# Patient Record
Sex: Female | Born: 1950
Health system: Southern US, Community
[De-identification: ages and names within clinical notes are randomized; demographics above are authoritative.]

## PROBLEM LIST (undated history)

## (undated) DIAGNOSIS — I10 Essential (primary) hypertension: Secondary | ICD-10-CM

## (undated) DIAGNOSIS — F419 Anxiety disorder, unspecified: Secondary | ICD-10-CM

## (undated) DIAGNOSIS — E785 Hyperlipidemia, unspecified: Secondary | ICD-10-CM

## (undated) DIAGNOSIS — F329 Major depressive disorder, single episode, unspecified: Secondary | ICD-10-CM

## (undated) DIAGNOSIS — G709 Myoneural disorder, unspecified: Secondary | ICD-10-CM

## (undated) DIAGNOSIS — F32A Depression, unspecified: Secondary | ICD-10-CM

## (undated) DIAGNOSIS — M810 Age-related osteoporosis without current pathological fracture: Secondary | ICD-10-CM

## (undated) DIAGNOSIS — G2581 Restless legs syndrome: Secondary | ICD-10-CM

## (undated) HISTORY — DX: Myoneural disorder, unspecified: G70.9

## (undated) HISTORY — DX: Major depressive disorder, single episode, unspecified: F32.9

## (undated) HISTORY — DX: Hyperlipidemia, unspecified: E78.5

## (undated) HISTORY — DX: Anxiety disorder, unspecified: F41.9

## (undated) HISTORY — PX: OTHER SURGICAL HISTORY: SHX169

## (undated) HISTORY — DX: Depression, unspecified: F32.A

## (undated) HISTORY — DX: Restless legs syndrome: G25.81

## (undated) HISTORY — DX: Essential (primary) hypertension: I10

## (undated) HISTORY — DX: Age-related osteoporosis without current pathological fracture: M81.0

---

## 2014-08-05 DIAGNOSIS — F4310 Post-traumatic stress disorder, unspecified: Secondary | ICD-10-CM | POA: Diagnosis not present

## 2014-08-27 DIAGNOSIS — Z87891 Personal history of nicotine dependence: Secondary | ICD-10-CM | POA: Diagnosis not present

## 2014-08-27 DIAGNOSIS — F411 Generalized anxiety disorder: Secondary | ICD-10-CM | POA: Diagnosis not present

## 2014-08-27 DIAGNOSIS — E785 Hyperlipidemia, unspecified: Secondary | ICD-10-CM | POA: Diagnosis not present

## 2014-08-27 DIAGNOSIS — M810 Age-related osteoporosis without current pathological fracture: Secondary | ICD-10-CM | POA: Diagnosis not present

## 2014-10-26 DIAGNOSIS — G2581 Restless legs syndrome: Secondary | ICD-10-CM | POA: Diagnosis not present

## 2014-10-26 DIAGNOSIS — F329 Major depressive disorder, single episode, unspecified: Secondary | ICD-10-CM | POA: Diagnosis not present

## 2014-10-26 DIAGNOSIS — F411 Generalized anxiety disorder: Secondary | ICD-10-CM | POA: Diagnosis not present

## 2014-10-26 DIAGNOSIS — E785 Hyperlipidemia, unspecified: Secondary | ICD-10-CM | POA: Diagnosis not present

## 2014-10-26 DIAGNOSIS — Z23 Encounter for immunization: Secondary | ICD-10-CM | POA: Diagnosis not present

## 2015-01-12 DIAGNOSIS — Z79899 Other long term (current) drug therapy: Secondary | ICD-10-CM | POA: Diagnosis not present

## 2015-01-12 DIAGNOSIS — Z23 Encounter for immunization: Secondary | ICD-10-CM | POA: Diagnosis not present

## 2015-01-12 DIAGNOSIS — G2581 Restless legs syndrome: Secondary | ICD-10-CM | POA: Diagnosis not present

## 2015-01-12 DIAGNOSIS — E785 Hyperlipidemia, unspecified: Secondary | ICD-10-CM | POA: Diagnosis not present

## 2015-01-12 DIAGNOSIS — F411 Generalized anxiety disorder: Secondary | ICD-10-CM | POA: Diagnosis not present

## 2015-08-10 DIAGNOSIS — G2581 Restless legs syndrome: Secondary | ICD-10-CM | POA: Diagnosis not present

## 2015-08-10 DIAGNOSIS — Z79899 Other long term (current) drug therapy: Secondary | ICD-10-CM | POA: Diagnosis not present

## 2015-08-10 DIAGNOSIS — E785 Hyperlipidemia, unspecified: Secondary | ICD-10-CM | POA: Diagnosis not present

## 2015-08-10 DIAGNOSIS — F411 Generalized anxiety disorder: Secondary | ICD-10-CM | POA: Diagnosis not present

## 2016-02-16 DIAGNOSIS — G2581 Restless legs syndrome: Secondary | ICD-10-CM | POA: Diagnosis not present

## 2016-02-16 DIAGNOSIS — F411 Generalized anxiety disorder: Secondary | ICD-10-CM | POA: Diagnosis not present

## 2016-02-16 DIAGNOSIS — M810 Age-related osteoporosis without current pathological fracture: Secondary | ICD-10-CM | POA: Diagnosis not present

## 2016-02-16 DIAGNOSIS — E785 Hyperlipidemia, unspecified: Secondary | ICD-10-CM | POA: Diagnosis not present

## 2016-02-16 DIAGNOSIS — Z23 Encounter for immunization: Secondary | ICD-10-CM | POA: Diagnosis not present

## 2016-02-16 DIAGNOSIS — Z1231 Encounter for screening mammogram for malignant neoplasm of breast: Secondary | ICD-10-CM | POA: Diagnosis not present

## 2016-02-16 DIAGNOSIS — I10 Essential (primary) hypertension: Secondary | ICD-10-CM | POA: Diagnosis not present

## 2016-02-16 DIAGNOSIS — F3342 Major depressive disorder, recurrent, in full remission: Secondary | ICD-10-CM | POA: Diagnosis not present

## 2016-02-25 ENCOUNTER — Other Ambulatory Visit: Payer: Self-pay | Admitting: Family Medicine

## 2016-02-25 DIAGNOSIS — Z780 Asymptomatic menopausal state: Secondary | ICD-10-CM

## 2016-02-25 DIAGNOSIS — Z1239 Encounter for other screening for malignant neoplasm of breast: Secondary | ICD-10-CM

## 2016-03-10 DIAGNOSIS — I10 Essential (primary) hypertension: Secondary | ICD-10-CM | POA: Diagnosis not present

## 2016-03-10 DIAGNOSIS — G2581 Restless legs syndrome: Secondary | ICD-10-CM | POA: Diagnosis not present

## 2016-05-21 DIAGNOSIS — I2510 Atherosclerotic heart disease of native coronary artery without angina pectoris: Secondary | ICD-10-CM | POA: Diagnosis not present

## 2016-05-21 DIAGNOSIS — Z87891 Personal history of nicotine dependence: Secondary | ICD-10-CM | POA: Diagnosis not present

## 2016-05-21 DIAGNOSIS — I700 Atherosclerosis of aorta: Secondary | ICD-10-CM | POA: Diagnosis not present

## 2016-05-21 DIAGNOSIS — M810 Age-related osteoporosis without current pathological fracture: Secondary | ICD-10-CM | POA: Diagnosis not present

## 2016-05-21 DIAGNOSIS — K760 Fatty (change of) liver, not elsewhere classified: Secondary | ICD-10-CM | POA: Diagnosis not present

## 2016-05-21 DIAGNOSIS — N289 Disorder of kidney and ureter, unspecified: Secondary | ICD-10-CM | POA: Diagnosis not present

## 2016-05-21 DIAGNOSIS — K625 Hemorrhage of anus and rectum: Secondary | ICD-10-CM | POA: Diagnosis not present

## 2016-05-21 DIAGNOSIS — K921 Melena: Secondary | ICD-10-CM | POA: Diagnosis not present

## 2016-05-21 DIAGNOSIS — Z79899 Other long term (current) drug therapy: Secondary | ICD-10-CM | POA: Diagnosis not present

## 2016-05-21 DIAGNOSIS — E7800 Pure hypercholesterolemia, unspecified: Secondary | ICD-10-CM | POA: Diagnosis not present

## 2016-05-21 DIAGNOSIS — F419 Anxiety disorder, unspecified: Secondary | ICD-10-CM | POA: Diagnosis not present

## 2016-05-21 DIAGNOSIS — R1033 Periumbilical pain: Secondary | ICD-10-CM | POA: Diagnosis not present

## 2016-06-07 DIAGNOSIS — G2581 Restless legs syndrome: Secondary | ICD-10-CM | POA: Diagnosis not present

## 2016-06-07 DIAGNOSIS — K625 Hemorrhage of anus and rectum: Secondary | ICD-10-CM | POA: Diagnosis not present

## 2016-06-07 DIAGNOSIS — F411 Generalized anxiety disorder: Secondary | ICD-10-CM | POA: Diagnosis not present

## 2016-06-07 DIAGNOSIS — I10 Essential (primary) hypertension: Secondary | ICD-10-CM | POA: Diagnosis not present

## 2016-06-07 DIAGNOSIS — E785 Hyperlipidemia, unspecified: Secondary | ICD-10-CM | POA: Diagnosis not present

## 2016-06-07 DIAGNOSIS — Z1159 Encounter for screening for other viral diseases: Secondary | ICD-10-CM | POA: Diagnosis not present

## 2016-06-07 DIAGNOSIS — F3342 Major depressive disorder, recurrent, in full remission: Secondary | ICD-10-CM | POA: Diagnosis not present

## 2016-06-07 DIAGNOSIS — Z780 Asymptomatic menopausal state: Secondary | ICD-10-CM | POA: Diagnosis not present

## 2016-06-07 DIAGNOSIS — R030 Elevated blood-pressure reading, without diagnosis of hypertension: Secondary | ICD-10-CM | POA: Diagnosis not present

## 2016-06-12 ENCOUNTER — Encounter: Payer: Self-pay | Admitting: Gastroenterology

## 2016-08-07 ENCOUNTER — Encounter: Payer: Self-pay | Admitting: Gastroenterology

## 2016-08-07 ENCOUNTER — Ambulatory Visit (AMBULATORY_SURGERY_CENTER): Payer: Self-pay | Admitting: *Deleted

## 2016-08-07 ENCOUNTER — Encounter (INDEPENDENT_AMBULATORY_CARE_PROVIDER_SITE_OTHER): Payer: Self-pay

## 2016-08-07 VITALS — Ht 63.0 in | Wt 133.0 lb

## 2016-08-07 DIAGNOSIS — Z1211 Encounter for screening for malignant neoplasm of colon: Secondary | ICD-10-CM

## 2016-08-07 MED ORDER — NA SULFATE-K SULFATE-MG SULF 17.5-3.13-1.6 GM/177ML PO SOLN: ORAL | 0 refills | Status: DC

## 2016-08-07 NOTE — Progress Notes (Signed)
Pt denies allergies to eggs or soy products. Denies difficulty with sedation or anesthesia. Denies any diet or weight loss medications. Denies use of supplemental oxygen.  Emmi instructions refused strongly.

## 2016-08-21 ENCOUNTER — Encounter: Payer: Self-pay | Admitting: Gastroenterology

## 2016-08-21 ENCOUNTER — Ambulatory Visit (AMBULATORY_SURGERY_CENTER): Payer: Medicare HMO | Admitting: Gastroenterology

## 2016-08-21 VITALS — BP 132/69 | HR 66 | Temp 98.9°F | Resp 18 | Ht 63.0 in | Wt 133.0 lb

## 2016-08-21 DIAGNOSIS — D122 Benign neoplasm of ascending colon: Secondary | ICD-10-CM | POA: Diagnosis not present

## 2016-08-21 DIAGNOSIS — D125 Benign neoplasm of sigmoid colon: Secondary | ICD-10-CM

## 2016-08-21 DIAGNOSIS — Z1212 Encounter for screening for malignant neoplasm of rectum: Secondary | ICD-10-CM | POA: Diagnosis not present

## 2016-08-21 DIAGNOSIS — Z1211 Encounter for screening for malignant neoplasm of colon: Secondary | ICD-10-CM

## 2016-08-21 DIAGNOSIS — K635 Polyp of colon: Secondary | ICD-10-CM

## 2016-08-21 MED ORDER — SODIUM CHLORIDE 0.9 % IV SOLN: 500.0000 mL | INTRAVENOUS | Status: AC

## 2016-08-21 NOTE — Op Note (Signed)
[facility] Patient Name: Tiffany Cruz Procedure Date: 08/21/2016 10:03 AM MRN: [id number] Endoscopist: Mallie Mussel L. Loletha Carrow , MD Age: 66 Referring MD:  Date of Birth: 22-Nov-1950 Gender: Female Account #: [id number] Procedure:                Colonoscopy Indications:              Screening for colorectal malignant neoplasm, This                            is the patient's first colonoscopy Medicines:                Monitored Anesthesia Care Procedure:                Pre-Anesthesia Assessment:                           - Prior to the procedure, a History and Physical                            was performed, and patient medications and                            allergies were reviewed. The patient's tolerance of                            previous anesthesia was also reviewed. The risks                            and benefits of the procedure and the sedation                            options and risks were discussed with the patient.                            All questions were answered, and informed consent                            was obtained. Prior Anticoagulants: The patient has                            taken no previous anticoagulant or antiplatelet                            agents. ASA Grade Assessment: II - A patient with                            mild systemic disease. After reviewing the risks                            and benefits, the patient was deemed in                            satisfactory condition to undergo the procedure.  After obtaining informed consent, the colonoscope                            was passed under direct vision. Throughout the                            procedure, the patient's blood pressure, pulse, and                            oxygen saturations were monitored continuously. The                            Colonoscope was introduced through the anus and                            advanced to the the cecum,  identified by                            appendiceal orifice and ileocecal valve. The                            colonoscopy was performed without difficulty. The                            patient tolerated the procedure well. The quality                            of the bowel preparation was good. The ileocecal                            valve, appendiceal orifice, and rectum were                            photographed. The quality of the bowel preparation                            was evaluated using the BBPS [facility] Bowel                            Preparation Scale) with scores of: Right Colon = 2,                            Transverse Colon = 2 and Left Colon = 2. The total                            BBPS score equals 6. After lavage. The bowel                            preparation used was SUPREP. Scope In: 10:11:41 AM Scope Out: 10:34:36 AM Scope Withdrawal Time: 0 hours 13 minutes 32 seconds  Total Procedure Duration: 0 hours 22 minutes 55 seconds  Findings:                 The digital rectal  exam findings include decreased                            sphincter tone.                           A 4 mm polyp was found in the ascending colon. The                            polyp was sessile. The polyp was removed with a                            cold snare. Resection and retrieval were complete.                           A 8 mm polyp was found in the distal sigmoid colon.                            The polyp was pedunculated. The polyp was removed                            with a hot snare. Resection and retrieval were                            complete.                           Internal hemorrhoids were found during retroflexion                            and during anoscopy. The hemorrhoids were small and                            Grade I (internal hemorrhoids that do not prolapse).                           The exam was otherwise without abnormality on                             direct and retroflexion views. Complications:            No immediate complications. Estimated Blood Loss:     Estimated blood loss: none. Impression:               - Decreased sphincter tone found on digital rectal                            exam.                           - One 4 mm polyp in the ascending colon, removed                            with a cold snare. Resected and retrieved.                           -  One 8 mm polyp in the distal sigmoid colon,                            removed with a hot snare. Resected and retrieved.                           - Internal hemorrhoids.                           - The examination was otherwise normal on direct                            and retroflexion views. Recommendation:           - Patient has a contact number available for                            emergencies. The signs and symptoms of potential                            delayed complications were discussed with the                            patient. Return to normal activities tomorrow.                            Written discharge instructions were provided to the                            patient.                           - Resume previous diet.                           - Continue present medications.                           - No aspirin, ibuprofen, naproxen, or other                            non-steroidal anti-inflammatory drugs for 5 days                            after polyp removal.                           - Await pathology results.                           - Repeat colonoscopy is recommended for                            surveillance. The colonoscopy date will be                            determined after pathology results  from today's                            exam become available for review. [employer] L. Loletha Carrow, MD 08/21/2016 10:35:22 AM This report has been signed electronically.

## 2016-08-21 NOTE — Progress Notes (Signed)
Pt's states no medical or surgical changes since previsit or office visit.n

## 2016-08-21 NOTE — Patient Instructions (Signed)
YOU HAD AN ENDOSCOPIC PROCEDURE TODAY AT [facility]:   Refer to the procedure report that was given to you for any specific questions about what was found during the examination.  If the procedure report does not answer your questions, please call your gastroenterologist to clarify.  If you requested that your care partner not be given the details of your procedure findings, then the procedure report has been included in a sealed envelope for you to review at your convenience later.  YOU SHOULD EXPECT: Some feelings of bloating in the abdomen. Passage of more gas than usual.  Walking can help get rid of the air that was put into your GI tract during the procedure and reduce the bloating. If you had a lower endoscopy (such as a colonoscopy or flexible sigmoidoscopy) you may notice spotting of blood in your stool or on the toilet paper. If you underwent a bowel prep for your procedure, you may not have a normal bowel movement for a few days.  Please Note:  You might notice some irritation and congestion in your nose or some drainage.  This is from the oxygen used during your procedure.  There is no need for concern and it should clear up in a day or so.  SYMPTOMS TO REPORT IMMEDIATELY:   Following lower endoscopy (colonoscopy or flexible sigmoidoscopy):  Excessive amounts of blood in the stool  Significant tenderness or worsening of abdominal pains  Swelling of the abdomen that is new, acute  Fever of 100F or higher  For urgent or emergent issues, a gastroenterologist can be reached at any hour by calling [phone].   DIET:  We do recommend a small meal at first, but then you may proceed to your regular diet.  Drink plenty of fluids but you should avoid alcoholic beverages for 24 hours.  ACTIVITY:  You should plan to take it easy for the rest of today and you should NOT DRIVE or use heavy machinery until tomorrow (because of the sedation medicines used during the test).     FOLLOW UP: Our staff will call the number listed on your records the next business day following your procedure to check on you and address any questions or concerns that you may have regarding the information given to you following your procedure. If we do not reach you, we will leave a message.  However, if you are feeling well and you are not experiencing any problems, there is no need to return our call.  We will assume that you have returned to your regular daily activities without incident.  If any biopsies were taken you will be contacted by phone or by letter within the next 1-3 weeks.  Please call us at [phone] if you have not heard about the biopsies in 3 weeks.   Await for biopsy results to determine next repeat Colonoscopy screening Polyps (handout given) No Aspirin, Ibuprofen, Naproxen or other non-steriodal anti-inflammatory drugs for 5 days after polyp removal Hemorrhoids (handout given)   SIGNATURES/CONFIDENTIALITY: You and/or your care partner have signed paperwork which will be entered into your electronic medical record.  These signatures attest to the fact that that the information above on your After Visit Summary has been reviewed and is understood.  Full responsibility of the confidentiality of this discharge information lies with you and/or your care-partner.

## 2016-08-21 NOTE — Progress Notes (Signed)
To PACU, vss patent aw report to rn 

## 2016-08-21 NOTE — Progress Notes (Signed)
Called to room to assist during endoscopic procedure.  Patient ID and intended procedure confirmed with present staff. Received instructions for my participation in the procedure from the performing physician.  

## 2016-08-22 ENCOUNTER — Telehealth: Payer: Self-pay

## 2016-08-22 NOTE — Telephone Encounter (Signed)
  Follow up Call-  Call back number 08/21/2016  Post procedure Call Back phone  # [phone]  Permission to leave phone message Yes     Patient questions:  Do you have a fever, pain , or abdominal swelling? No. Pain Score  0 *  Have you tolerated food without any problems? Yes.    Have you been able to return to your normal activities? Yes.    Do you have any questions about your discharge instructions: Diet   No. Medications  No. Follow up visit  No.  Do you have questions or concerns about your Care? No.  Actions: * If pain score is 4 or above: No action needed, pain <4.

## 2016-08-28 ENCOUNTER — Encounter: Payer: Self-pay | Admitting: Gastroenterology

## 2016-10-02 DIAGNOSIS — F3342 Major depressive disorder, recurrent, in full remission: Secondary | ICD-10-CM | POA: Diagnosis not present

## 2016-10-02 DIAGNOSIS — Z780 Asymptomatic menopausal state: Secondary | ICD-10-CM | POA: Diagnosis not present

## 2016-10-02 DIAGNOSIS — I10 Essential (primary) hypertension: Secondary | ICD-10-CM | POA: Diagnosis not present

## 2016-10-02 DIAGNOSIS — Z23 Encounter for immunization: Secondary | ICD-10-CM | POA: Diagnosis not present

## 2016-10-02 DIAGNOSIS — F411 Generalized anxiety disorder: Secondary | ICD-10-CM | POA: Diagnosis not present

## 2016-10-02 DIAGNOSIS — E785 Hyperlipidemia, unspecified: Secondary | ICD-10-CM | POA: Diagnosis not present

## 2016-10-02 DIAGNOSIS — G2581 Restless legs syndrome: Secondary | ICD-10-CM | POA: Diagnosis not present

## 2016-11-03 DIAGNOSIS — Z23 Encounter for immunization: Secondary | ICD-10-CM | POA: Diagnosis not present

## 2016-11-03 DIAGNOSIS — L700 Acne vulgaris: Secondary | ICD-10-CM | POA: Diagnosis not present

## 2016-11-03 DIAGNOSIS — I10 Essential (primary) hypertension: Secondary | ICD-10-CM | POA: Diagnosis not present

## 2016-11-03 DIAGNOSIS — F411 Generalized anxiety disorder: Secondary | ICD-10-CM | POA: Diagnosis not present

## 2016-11-27 DIAGNOSIS — M8588 Other specified disorders of bone density and structure, other site: Secondary | ICD-10-CM | POA: Diagnosis not present

## 2016-11-27 DIAGNOSIS — Z780 Asymptomatic menopausal state: Secondary | ICD-10-CM | POA: Diagnosis not present

## 2016-11-27 DIAGNOSIS — M85852 Other specified disorders of bone density and structure, left thigh: Secondary | ICD-10-CM | POA: Diagnosis not present

## 2016-12-20 DIAGNOSIS — L700 Acne vulgaris: Secondary | ICD-10-CM | POA: Diagnosis not present

## 2016-12-20 DIAGNOSIS — M810 Age-related osteoporosis without current pathological fracture: Secondary | ICD-10-CM | POA: Diagnosis not present

## 2017-05-01 DIAGNOSIS — F411 Generalized anxiety disorder: Secondary | ICD-10-CM | POA: Diagnosis not present

## 2017-05-01 DIAGNOSIS — I10 Essential (primary) hypertension: Secondary | ICD-10-CM | POA: Diagnosis not present

## 2017-05-01 DIAGNOSIS — E785 Hyperlipidemia, unspecified: Secondary | ICD-10-CM | POA: Diagnosis not present

## 2017-05-01 DIAGNOSIS — G2581 Restless legs syndrome: Secondary | ICD-10-CM | POA: Diagnosis not present

## 2017-05-01 DIAGNOSIS — M654 Radial styloid tenosynovitis [de Quervain]: Secondary | ICD-10-CM | POA: Diagnosis not present

## 2017-05-01 DIAGNOSIS — M810 Age-related osteoporosis without current pathological fracture: Secondary | ICD-10-CM | POA: Diagnosis not present

## 2017-05-01 DIAGNOSIS — E559 Vitamin D deficiency, unspecified: Secondary | ICD-10-CM | POA: Diagnosis not present

## 2017-05-21 ENCOUNTER — Encounter (HOSPITAL_COMMUNITY): Payer: Self-pay

## 2017-05-21 ENCOUNTER — Ambulatory Visit (HOSPITAL_COMMUNITY)
Admission: RE | Admit: 2017-05-21 | Discharge: 2017-05-21 | Disposition: A | Discharge status: Home / Self Care | Payer: Medicare HMO | Source: Ambulatory Visit | Attending: Family Medicine | Admitting: Family Medicine

## 2017-05-21 DIAGNOSIS — M810 Age-related osteoporosis without current pathological fracture: Secondary | ICD-10-CM | POA: Insufficient documentation

## 2017-05-21 MED ORDER — SODIUM CHLORIDE 0.9 % IV SOLN
Freq: Once | INTRAVENOUS | Status: AC
  Administered 2017-05-21: 11:00:00 via INTRAVENOUS

## 2017-05-21 MED ORDER — ZOLEDRONIC ACID 5 MG/100ML IV SOLN
5.0000 mg | Freq: Once | INTRAVENOUS | Status: AC
  Administered 2017-05-21: 5 mg via INTRAVENOUS
  Filled 2017-05-21: qty 100

## 2017-05-21 NOTE — Discharge Instructions (Signed)

## 2017-11-07 ENCOUNTER — Other Ambulatory Visit: Payer: Self-pay | Admitting: Family Medicine

## 2017-11-07 DIAGNOSIS — E559 Vitamin D deficiency, unspecified: Secondary | ICD-10-CM | POA: Diagnosis not present

## 2017-11-07 DIAGNOSIS — I10 Essential (primary) hypertension: Secondary | ICD-10-CM | POA: Diagnosis not present

## 2017-11-07 DIAGNOSIS — Z23 Encounter for immunization: Secondary | ICD-10-CM | POA: Diagnosis not present

## 2017-11-07 DIAGNOSIS — F411 Generalized anxiety disorder: Secondary | ICD-10-CM | POA: Diagnosis not present

## 2017-11-07 DIAGNOSIS — E785 Hyperlipidemia, unspecified: Secondary | ICD-10-CM | POA: Diagnosis not present

## 2017-11-07 DIAGNOSIS — Z1239 Encounter for other screening for malignant neoplasm of breast: Secondary | ICD-10-CM | POA: Diagnosis not present

## 2017-11-07 DIAGNOSIS — G2581 Restless legs syndrome: Secondary | ICD-10-CM | POA: Diagnosis not present

## 2017-11-07 DIAGNOSIS — Z6823 Body mass index (BMI) 23.0-23.9, adult: Secondary | ICD-10-CM | POA: Diagnosis not present

## 2017-11-07 DIAGNOSIS — M810 Age-related osteoporosis without current pathological fracture: Secondary | ICD-10-CM | POA: Diagnosis not present

## 2017-11-07 DIAGNOSIS — Z1231 Encounter for screening mammogram for malignant neoplasm of breast: Secondary | ICD-10-CM

## 2018-05-14 DIAGNOSIS — G2581 Restless legs syndrome: Secondary | ICD-10-CM | POA: Diagnosis not present

## 2018-05-14 DIAGNOSIS — Z1231 Encounter for screening mammogram for malignant neoplasm of breast: Secondary | ICD-10-CM | POA: Diagnosis not present

## 2018-05-14 DIAGNOSIS — E785 Hyperlipidemia, unspecified: Secondary | ICD-10-CM | POA: Diagnosis not present

## 2018-05-14 DIAGNOSIS — F411 Generalized anxiety disorder: Secondary | ICD-10-CM | POA: Diagnosis not present

## 2018-05-14 DIAGNOSIS — I10 Essential (primary) hypertension: Secondary | ICD-10-CM | POA: Diagnosis not present

## 2018-05-14 DIAGNOSIS — E559 Vitamin D deficiency, unspecified: Secondary | ICD-10-CM | POA: Diagnosis not present

## 2018-05-16 ENCOUNTER — Other Ambulatory Visit: Payer: Self-pay | Admitting: Family Medicine

## 2018-05-16 DIAGNOSIS — Z1231 Encounter for screening mammogram for malignant neoplasm of breast: Secondary | ICD-10-CM

## 2018-08-02 ENCOUNTER — Ambulatory Visit: Payer: Medicare HMO

## 2018-08-23 DIAGNOSIS — L03116 Cellulitis of left lower limb: Secondary | ICD-10-CM | POA: Diagnosis not present

## 2018-08-23 DIAGNOSIS — W57XXXA Bitten or stung by nonvenomous insect and other nonvenomous arthropods, initial encounter: Secondary | ICD-10-CM | POA: Diagnosis not present

## 2018-09-12 ENCOUNTER — Ambulatory Visit
Admission: RE | Admit: 2018-09-12 | Discharge: 2018-09-12 | Disposition: A | Discharge status: Home / Self Care | Payer: Medicare HMO | Source: Ambulatory Visit | Attending: Family Medicine | Admitting: Family Medicine

## 2018-09-12 ENCOUNTER — Other Ambulatory Visit: Payer: Self-pay

## 2018-09-12 DIAGNOSIS — Z1231 Encounter for screening mammogram for malignant neoplasm of breast: Secondary | ICD-10-CM | POA: Diagnosis not present

## 2018-10-11 DIAGNOSIS — F3342 Major depressive disorder, recurrent, in full remission: Secondary | ICD-10-CM | POA: Diagnosis not present

## 2018-10-11 DIAGNOSIS — E785 Hyperlipidemia, unspecified: Secondary | ICD-10-CM | POA: Diagnosis not present

## 2018-10-11 DIAGNOSIS — Z0000 Encounter for general adult medical examination without abnormal findings: Secondary | ICD-10-CM | POA: Diagnosis not present

## 2018-10-11 DIAGNOSIS — F411 Generalized anxiety disorder: Secondary | ICD-10-CM | POA: Diagnosis not present

## 2018-10-11 DIAGNOSIS — M810 Age-related osteoporosis without current pathological fracture: Secondary | ICD-10-CM | POA: Diagnosis not present

## 2018-10-11 DIAGNOSIS — G2581 Restless legs syndrome: Secondary | ICD-10-CM | POA: Diagnosis not present

## 2018-10-22 DIAGNOSIS — M859 Disorder of bone density and structure, unspecified: Secondary | ICD-10-CM | POA: Diagnosis not present

## 2018-10-22 DIAGNOSIS — E785 Hyperlipidemia, unspecified: Secondary | ICD-10-CM | POA: Diagnosis not present

## 2018-10-22 DIAGNOSIS — D509 Iron deficiency anemia, unspecified: Secondary | ICD-10-CM | POA: Diagnosis not present

## 2018-10-22 DIAGNOSIS — G2581 Restless legs syndrome: Secondary | ICD-10-CM | POA: Diagnosis not present

## 2018-10-24 ENCOUNTER — Other Ambulatory Visit: Payer: Self-pay | Admitting: Family Medicine

## 2018-10-24 DIAGNOSIS — M810 Age-related osteoporosis without current pathological fracture: Secondary | ICD-10-CM

## 2018-11-04 ENCOUNTER — Other Ambulatory Visit (HOSPITAL_COMMUNITY): Payer: Self-pay | Admitting: *Deleted

## 2018-11-05 ENCOUNTER — Other Ambulatory Visit: Payer: Self-pay

## 2018-11-05 ENCOUNTER — Ambulatory Visit (HOSPITAL_COMMUNITY)
Admission: RE | Admit: 2018-11-05 | Discharge: 2018-11-05 | Disposition: A | Discharge status: Home / Self Care | Payer: Medicare HMO | Source: Ambulatory Visit | Attending: Family Medicine | Admitting: Family Medicine

## 2018-11-05 DIAGNOSIS — M810 Age-related osteoporosis without current pathological fracture: Secondary | ICD-10-CM | POA: Diagnosis not present

## 2018-11-05 MED ORDER — ZOLEDRONIC ACID 5 MG/100ML IV SOLN
INTRAVENOUS | Status: AC
  Administered 2018-11-05: 08:00:00 5 mg via INTRAVENOUS
  Filled 2018-11-05: qty 100

## 2018-11-05 MED ORDER — ZOLEDRONIC ACID 5 MG/100ML IV SOLN
5.0000 mg | Freq: Once | INTRAVENOUS | Status: AC
  Administered 2018-11-05: 08:00:00 5 mg via INTRAVENOUS

## 2018-12-22 DIAGNOSIS — M810 Age-related osteoporosis without current pathological fracture: Secondary | ICD-10-CM | POA: Diagnosis not present

## 2018-12-22 DIAGNOSIS — F3342 Major depressive disorder, recurrent, in full remission: Secondary | ICD-10-CM | POA: Diagnosis not present

## 2018-12-22 DIAGNOSIS — I10 Essential (primary) hypertension: Secondary | ICD-10-CM | POA: Diagnosis not present

## 2018-12-22 DIAGNOSIS — E785 Hyperlipidemia, unspecified: Secondary | ICD-10-CM | POA: Diagnosis not present

## 2018-12-22 DIAGNOSIS — D509 Iron deficiency anemia, unspecified: Secondary | ICD-10-CM | POA: Diagnosis not present

## 2019-01-15 DIAGNOSIS — D229 Melanocytic nevi, unspecified: Secondary | ICD-10-CM | POA: Diagnosis not present

## 2019-01-27 DIAGNOSIS — F3342 Major depressive disorder, recurrent, in full remission: Secondary | ICD-10-CM | POA: Diagnosis not present

## 2019-01-27 DIAGNOSIS — E785 Hyperlipidemia, unspecified: Secondary | ICD-10-CM | POA: Diagnosis not present

## 2019-01-27 DIAGNOSIS — M810 Age-related osteoporosis without current pathological fracture: Secondary | ICD-10-CM | POA: Diagnosis not present

## 2019-01-27 DIAGNOSIS — I10 Essential (primary) hypertension: Secondary | ICD-10-CM | POA: Diagnosis not present

## 2019-01-27 DIAGNOSIS — D509 Iron deficiency anemia, unspecified: Secondary | ICD-10-CM | POA: Diagnosis not present

## 2019-03-19 DIAGNOSIS — F3342 Major depressive disorder, recurrent, in full remission: Secondary | ICD-10-CM | POA: Diagnosis not present

## 2019-03-19 DIAGNOSIS — I10 Essential (primary) hypertension: Secondary | ICD-10-CM | POA: Diagnosis not present

## 2019-03-19 DIAGNOSIS — M810 Age-related osteoporosis without current pathological fracture: Secondary | ICD-10-CM | POA: Diagnosis not present

## 2019-03-19 DIAGNOSIS — D509 Iron deficiency anemia, unspecified: Secondary | ICD-10-CM | POA: Diagnosis not present

## 2019-03-19 DIAGNOSIS — E785 Hyperlipidemia, unspecified: Secondary | ICD-10-CM | POA: Diagnosis not present

## 2019-07-21 DIAGNOSIS — E785 Hyperlipidemia, unspecified: Secondary | ICD-10-CM | POA: Diagnosis not present

## 2019-07-21 DIAGNOSIS — I10 Essential (primary) hypertension: Secondary | ICD-10-CM | POA: Diagnosis not present

## 2019-07-21 DIAGNOSIS — F3342 Major depressive disorder, recurrent, in full remission: Secondary | ICD-10-CM | POA: Diagnosis not present

## 2019-07-21 DIAGNOSIS — D509 Iron deficiency anemia, unspecified: Secondary | ICD-10-CM | POA: Diagnosis not present

## 2019-07-21 DIAGNOSIS — M810 Age-related osteoporosis without current pathological fracture: Secondary | ICD-10-CM | POA: Diagnosis not present

## 2019-09-11 DIAGNOSIS — F3342 Major depressive disorder, recurrent, in full remission: Secondary | ICD-10-CM | POA: Diagnosis not present

## 2019-09-11 DIAGNOSIS — D509 Iron deficiency anemia, unspecified: Secondary | ICD-10-CM | POA: Diagnosis not present

## 2019-09-11 DIAGNOSIS — E785 Hyperlipidemia, unspecified: Secondary | ICD-10-CM | POA: Diagnosis not present

## 2019-09-11 DIAGNOSIS — I10 Essential (primary) hypertension: Secondary | ICD-10-CM | POA: Diagnosis not present

## 2019-09-11 DIAGNOSIS — M810 Age-related osteoporosis without current pathological fracture: Secondary | ICD-10-CM | POA: Diagnosis not present

## 2019-09-26 DIAGNOSIS — D509 Iron deficiency anemia, unspecified: Secondary | ICD-10-CM | POA: Diagnosis not present

## 2019-09-26 DIAGNOSIS — E785 Hyperlipidemia, unspecified: Secondary | ICD-10-CM | POA: Diagnosis not present

## 2019-09-26 DIAGNOSIS — M810 Age-related osteoporosis without current pathological fracture: Secondary | ICD-10-CM | POA: Diagnosis not present

## 2019-09-26 DIAGNOSIS — F3342 Major depressive disorder, recurrent, in full remission: Secondary | ICD-10-CM | POA: Diagnosis not present

## 2019-09-26 DIAGNOSIS — I10 Essential (primary) hypertension: Secondary | ICD-10-CM | POA: Diagnosis not present

## 2019-10-15 DIAGNOSIS — M810 Age-related osteoporosis without current pathological fracture: Secondary | ICD-10-CM | POA: Diagnosis not present

## 2019-10-15 DIAGNOSIS — F3342 Major depressive disorder, recurrent, in full remission: Secondary | ICD-10-CM | POA: Diagnosis not present

## 2019-10-15 DIAGNOSIS — Z0000 Encounter for general adult medical examination without abnormal findings: Secondary | ICD-10-CM | POA: Diagnosis not present

## 2019-10-15 DIAGNOSIS — E785 Hyperlipidemia, unspecified: Secondary | ICD-10-CM | POA: Diagnosis not present

## 2019-10-15 DIAGNOSIS — G2581 Restless legs syndrome: Secondary | ICD-10-CM | POA: Diagnosis not present

## 2019-10-15 DIAGNOSIS — M65841 Other synovitis and tenosynovitis, right hand: Secondary | ICD-10-CM | POA: Diagnosis not present

## 2019-10-15 DIAGNOSIS — I10 Essential (primary) hypertension: Secondary | ICD-10-CM | POA: Diagnosis not present

## 2019-10-15 DIAGNOSIS — F411 Generalized anxiety disorder: Secondary | ICD-10-CM | POA: Diagnosis not present

## 2019-10-17 ENCOUNTER — Other Ambulatory Visit: Payer: Self-pay | Admitting: Family Medicine

## 2019-10-17 DIAGNOSIS — Z1231 Encounter for screening mammogram for malignant neoplasm of breast: Secondary | ICD-10-CM

## 2019-10-17 DIAGNOSIS — M810 Age-related osteoporosis without current pathological fracture: Secondary | ICD-10-CM

## 2019-11-21 DIAGNOSIS — E785 Hyperlipidemia, unspecified: Secondary | ICD-10-CM | POA: Diagnosis not present

## 2019-11-21 DIAGNOSIS — E559 Vitamin D deficiency, unspecified: Secondary | ICD-10-CM | POA: Diagnosis not present

## 2019-11-21 DIAGNOSIS — D509 Iron deficiency anemia, unspecified: Secondary | ICD-10-CM | POA: Diagnosis not present

## 2019-11-28 DIAGNOSIS — F3342 Major depressive disorder, recurrent, in full remission: Secondary | ICD-10-CM | POA: Diagnosis not present

## 2019-11-28 DIAGNOSIS — I10 Essential (primary) hypertension: Secondary | ICD-10-CM | POA: Diagnosis not present

## 2019-11-28 DIAGNOSIS — E785 Hyperlipidemia, unspecified: Secondary | ICD-10-CM | POA: Diagnosis not present

## 2019-11-28 DIAGNOSIS — I129 Hypertensive chronic kidney disease with stage 1 through stage 4 chronic kidney disease, or unspecified chronic kidney disease: Secondary | ICD-10-CM | POA: Diagnosis not present

## 2019-11-28 DIAGNOSIS — N1831 Chronic kidney disease, stage 3a: Secondary | ICD-10-CM | POA: Diagnosis not present

## 2019-11-28 DIAGNOSIS — D509 Iron deficiency anemia, unspecified: Secondary | ICD-10-CM | POA: Diagnosis not present

## 2019-11-28 DIAGNOSIS — M810 Age-related osteoporosis without current pathological fracture: Secondary | ICD-10-CM | POA: Diagnosis not present

## 2019-12-05 DIAGNOSIS — N289 Disorder of kidney and ureter, unspecified: Secondary | ICD-10-CM | POA: Diagnosis not present

## 2019-12-12 ENCOUNTER — Other Ambulatory Visit (HOSPITAL_COMMUNITY): Payer: Self-pay | Admitting: *Deleted

## 2019-12-12 ENCOUNTER — Ambulatory Visit (HOSPITAL_COMMUNITY): Payer: Medicare HMO

## 2019-12-15 ENCOUNTER — Ambulatory Visit (HOSPITAL_COMMUNITY): Payer: Medicare HMO

## 2019-12-16 DIAGNOSIS — F3342 Major depressive disorder, recurrent, in full remission: Secondary | ICD-10-CM | POA: Diagnosis not present

## 2019-12-16 DIAGNOSIS — M810 Age-related osteoporosis without current pathological fracture: Secondary | ICD-10-CM | POA: Diagnosis not present

## 2019-12-16 DIAGNOSIS — I10 Essential (primary) hypertension: Secondary | ICD-10-CM | POA: Diagnosis not present

## 2019-12-16 DIAGNOSIS — I129 Hypertensive chronic kidney disease with stage 1 through stage 4 chronic kidney disease, or unspecified chronic kidney disease: Secondary | ICD-10-CM | POA: Diagnosis not present

## 2019-12-16 DIAGNOSIS — E785 Hyperlipidemia, unspecified: Secondary | ICD-10-CM | POA: Diagnosis not present

## 2019-12-16 DIAGNOSIS — D509 Iron deficiency anemia, unspecified: Secondary | ICD-10-CM | POA: Diagnosis not present

## 2019-12-16 DIAGNOSIS — N1831 Chronic kidney disease, stage 3a: Secondary | ICD-10-CM | POA: Diagnosis not present

## 2020-03-09 DIAGNOSIS — F3342 Major depressive disorder, recurrent, in full remission: Secondary | ICD-10-CM | POA: Diagnosis not present

## 2020-03-09 DIAGNOSIS — I129 Hypertensive chronic kidney disease with stage 1 through stage 4 chronic kidney disease, or unspecified chronic kidney disease: Secondary | ICD-10-CM | POA: Diagnosis not present

## 2020-03-09 DIAGNOSIS — E785 Hyperlipidemia, unspecified: Secondary | ICD-10-CM | POA: Diagnosis not present

## 2020-03-09 DIAGNOSIS — I10 Essential (primary) hypertension: Secondary | ICD-10-CM | POA: Diagnosis not present

## 2020-03-09 DIAGNOSIS — D509 Iron deficiency anemia, unspecified: Secondary | ICD-10-CM | POA: Diagnosis not present

## 2020-03-09 DIAGNOSIS — M810 Age-related osteoporosis without current pathological fracture: Secondary | ICD-10-CM | POA: Diagnosis not present

## 2020-03-09 DIAGNOSIS — N1831 Chronic kidney disease, stage 3a: Secondary | ICD-10-CM | POA: Diagnosis not present

## 2020-09-27 DIAGNOSIS — I129 Hypertensive chronic kidney disease with stage 1 through stage 4 chronic kidney disease, or unspecified chronic kidney disease: Secondary | ICD-10-CM | POA: Diagnosis not present

## 2020-09-27 DIAGNOSIS — I10 Essential (primary) hypertension: Secondary | ICD-10-CM | POA: Diagnosis not present

## 2020-09-27 DIAGNOSIS — F3342 Major depressive disorder, recurrent, in full remission: Secondary | ICD-10-CM | POA: Diagnosis not present

## 2020-09-27 DIAGNOSIS — D509 Iron deficiency anemia, unspecified: Secondary | ICD-10-CM | POA: Diagnosis not present

## 2020-09-27 DIAGNOSIS — N1831 Chronic kidney disease, stage 3a: Secondary | ICD-10-CM | POA: Diagnosis not present

## 2020-09-27 DIAGNOSIS — E785 Hyperlipidemia, unspecified: Secondary | ICD-10-CM | POA: Diagnosis not present

## 2020-09-27 DIAGNOSIS — M810 Age-related osteoporosis without current pathological fracture: Secondary | ICD-10-CM | POA: Diagnosis not present

## 2020-10-29 DIAGNOSIS — Z1231 Encounter for screening mammogram for malignant neoplasm of breast: Secondary | ICD-10-CM | POA: Diagnosis not present

## 2020-10-29 DIAGNOSIS — F411 Generalized anxiety disorder: Secondary | ICD-10-CM | POA: Diagnosis not present

## 2020-10-29 DIAGNOSIS — E785 Hyperlipidemia, unspecified: Secondary | ICD-10-CM | POA: Diagnosis not present

## 2020-10-29 DIAGNOSIS — M810 Age-related osteoporosis without current pathological fracture: Secondary | ICD-10-CM | POA: Diagnosis not present

## 2020-10-29 DIAGNOSIS — I10 Essential (primary) hypertension: Secondary | ICD-10-CM | POA: Diagnosis not present

## 2020-10-29 DIAGNOSIS — G2581 Restless legs syndrome: Secondary | ICD-10-CM | POA: Diagnosis not present

## 2020-11-09 ENCOUNTER — Other Ambulatory Visit: Payer: Self-pay | Admitting: Family Medicine

## 2020-11-09 DIAGNOSIS — M810 Age-related osteoporosis without current pathological fracture: Secondary | ICD-10-CM

## 2020-11-09 DIAGNOSIS — Z1389 Encounter for screening for other disorder: Secondary | ICD-10-CM | POA: Diagnosis not present

## 2020-11-09 DIAGNOSIS — Z0000 Encounter for general adult medical examination without abnormal findings: Secondary | ICD-10-CM | POA: Diagnosis not present

## 2020-11-19 ENCOUNTER — Other Ambulatory Visit: Payer: Self-pay | Admitting: Family Medicine

## 2020-11-19 DIAGNOSIS — Z1231 Encounter for screening mammogram for malignant neoplasm of breast: Secondary | ICD-10-CM

## 2020-11-24 DIAGNOSIS — R1110 Vomiting, unspecified: Secondary | ICD-10-CM | POA: Diagnosis not present

## 2020-11-25 DIAGNOSIS — Z20822 Contact with and (suspected) exposure to covid-19: Secondary | ICD-10-CM | POA: Diagnosis not present

## 2020-11-25 DIAGNOSIS — R1110 Vomiting, unspecified: Secondary | ICD-10-CM | POA: Diagnosis not present

## 2020-11-29 DIAGNOSIS — Z20822 Contact with and (suspected) exposure to covid-19: Secondary | ICD-10-CM | POA: Diagnosis not present

## 2020-11-29 DIAGNOSIS — J069 Acute upper respiratory infection, unspecified: Secondary | ICD-10-CM | POA: Diagnosis not present

## 2020-11-30 DIAGNOSIS — K222 Esophageal obstruction: Secondary | ICD-10-CM | POA: Diagnosis not present

## 2020-11-30 DIAGNOSIS — E860 Dehydration: Secondary | ICD-10-CM | POA: Diagnosis not present

## 2020-11-30 DIAGNOSIS — E876 Hypokalemia: Secondary | ICD-10-CM | POA: Diagnosis not present

## 2020-11-30 DIAGNOSIS — R9431 Abnormal electrocardiogram [ECG] [EKG]: Secondary | ICD-10-CM | POA: Diagnosis not present

## 2020-11-30 DIAGNOSIS — R112 Nausea with vomiting, unspecified: Secondary | ICD-10-CM | POA: Diagnosis not present

## 2020-11-30 DIAGNOSIS — K219 Gastro-esophageal reflux disease without esophagitis: Secondary | ICD-10-CM | POA: Diagnosis not present

## 2020-11-30 DIAGNOSIS — J984 Other disorders of lung: Secondary | ICD-10-CM | POA: Diagnosis not present

## 2020-11-30 DIAGNOSIS — K264 Chronic or unspecified duodenal ulcer with hemorrhage: Secondary | ICD-10-CM | POA: Diagnosis not present

## 2020-11-30 DIAGNOSIS — D500 Iron deficiency anemia secondary to blood loss (chronic): Secondary | ICD-10-CM | POA: Diagnosis not present

## 2020-11-30 DIAGNOSIS — N184 Chronic kidney disease, stage 4 (severe): Secondary | ICD-10-CM | POA: Diagnosis not present

## 2020-11-30 DIAGNOSIS — N179 Acute kidney failure, unspecified: Secondary | ICD-10-CM | POA: Diagnosis not present

## 2020-11-30 DIAGNOSIS — F17210 Nicotine dependence, cigarettes, uncomplicated: Secondary | ICD-10-CM | POA: Diagnosis not present

## 2020-12-01 DIAGNOSIS — K219 Gastro-esophageal reflux disease without esophagitis: Secondary | ICD-10-CM | POA: Diagnosis not present

## 2020-12-01 DIAGNOSIS — E876 Hypokalemia: Secondary | ICD-10-CM | POA: Diagnosis not present

## 2020-12-01 DIAGNOSIS — R112 Nausea with vomiting, unspecified: Secondary | ICD-10-CM | POA: Diagnosis not present

## 2020-12-01 DIAGNOSIS — N179 Acute kidney failure, unspecified: Secondary | ICD-10-CM | POA: Diagnosis not present

## 2020-12-01 DIAGNOSIS — Z720 Tobacco use: Secondary | ICD-10-CM | POA: Diagnosis not present

## 2020-12-02 DIAGNOSIS — Z720 Tobacco use: Secondary | ICD-10-CM | POA: Diagnosis not present

## 2020-12-02 DIAGNOSIS — N179 Acute kidney failure, unspecified: Secondary | ICD-10-CM | POA: Diagnosis not present

## 2020-12-02 DIAGNOSIS — K269 Duodenal ulcer, unspecified as acute or chronic, without hemorrhage or perforation: Secondary | ICD-10-CM | POA: Diagnosis not present

## 2020-12-02 DIAGNOSIS — D62 Acute posthemorrhagic anemia: Secondary | ICD-10-CM | POA: Diagnosis not present

## 2020-12-02 DIAGNOSIS — E876 Hypokalemia: Secondary | ICD-10-CM | POA: Diagnosis not present

## 2020-12-02 DIAGNOSIS — K219 Gastro-esophageal reflux disease without esophagitis: Secondary | ICD-10-CM | POA: Diagnosis not present

## 2020-12-02 DIAGNOSIS — B9681 Helicobacter pylori [H. pylori] as the cause of diseases classified elsewhere: Secondary | ICD-10-CM | POA: Diagnosis not present

## 2020-12-02 DIAGNOSIS — K2950 Unspecified chronic gastritis without bleeding: Secondary | ICD-10-CM | POA: Diagnosis not present

## 2020-12-02 DIAGNOSIS — R112 Nausea with vomiting, unspecified: Secondary | ICD-10-CM | POA: Diagnosis not present

## 2020-12-02 DIAGNOSIS — K311 Adult hypertrophic pyloric stenosis: Secondary | ICD-10-CM | POA: Diagnosis not present

## 2020-12-03 DIAGNOSIS — E876 Hypokalemia: Secondary | ICD-10-CM | POA: Diagnosis not present

## 2020-12-03 DIAGNOSIS — D62 Acute posthemorrhagic anemia: Secondary | ICD-10-CM | POA: Diagnosis not present

## 2020-12-03 DIAGNOSIS — N179 Acute kidney failure, unspecified: Secondary | ICD-10-CM | POA: Diagnosis not present

## 2020-12-03 DIAGNOSIS — R112 Nausea with vomiting, unspecified: Secondary | ICD-10-CM | POA: Diagnosis not present

## 2020-12-03 DIAGNOSIS — K219 Gastro-esophageal reflux disease without esophagitis: Secondary | ICD-10-CM | POA: Diagnosis not present

## 2020-12-03 DIAGNOSIS — K269 Duodenal ulcer, unspecified as acute or chronic, without hemorrhage or perforation: Secondary | ICD-10-CM | POA: Diagnosis not present

## 2020-12-03 DIAGNOSIS — Z720 Tobacco use: Secondary | ICD-10-CM | POA: Diagnosis not present

## 2020-12-04 DIAGNOSIS — N179 Acute kidney failure, unspecified: Secondary | ICD-10-CM | POA: Diagnosis not present

## 2020-12-05 DIAGNOSIS — N179 Acute kidney failure, unspecified: Secondary | ICD-10-CM | POA: Diagnosis not present

## 2020-12-06 DIAGNOSIS — R112 Nausea with vomiting, unspecified: Secondary | ICD-10-CM | POA: Diagnosis not present

## 2020-12-06 DIAGNOSIS — K922 Gastrointestinal hemorrhage, unspecified: Secondary | ICD-10-CM | POA: Diagnosis not present

## 2020-12-06 DIAGNOSIS — Z720 Tobacco use: Secondary | ICD-10-CM | POA: Diagnosis not present

## 2020-12-06 DIAGNOSIS — E876 Hypokalemia: Secondary | ICD-10-CM | POA: Diagnosis not present

## 2020-12-06 DIAGNOSIS — D62 Acute posthemorrhagic anemia: Secondary | ICD-10-CM | POA: Diagnosis not present

## 2020-12-06 DIAGNOSIS — K219 Gastro-esophageal reflux disease without esophagitis: Secondary | ICD-10-CM | POA: Diagnosis not present

## 2020-12-06 DIAGNOSIS — I700 Atherosclerosis of aorta: Secondary | ICD-10-CM | POA: Diagnosis not present

## 2020-12-06 DIAGNOSIS — K269 Duodenal ulcer, unspecified as acute or chronic, without hemorrhage or perforation: Secondary | ICD-10-CM | POA: Diagnosis not present

## 2020-12-06 DIAGNOSIS — N179 Acute kidney failure, unspecified: Secondary | ICD-10-CM | POA: Diagnosis not present

## 2020-12-07 DIAGNOSIS — K254 Chronic or unspecified gastric ulcer with hemorrhage: Secondary | ICD-10-CM | POA: Diagnosis not present

## 2020-12-07 DIAGNOSIS — D62 Acute posthemorrhagic anemia: Secondary | ICD-10-CM | POA: Diagnosis not present

## 2020-12-07 DIAGNOSIS — N179 Acute kidney failure, unspecified: Secondary | ICD-10-CM | POA: Diagnosis not present

## 2020-12-07 DIAGNOSIS — K269 Duodenal ulcer, unspecified as acute or chronic, without hemorrhage or perforation: Secondary | ICD-10-CM | POA: Diagnosis not present

## 2020-12-07 DIAGNOSIS — K315 Obstruction of duodenum: Secondary | ICD-10-CM | POA: Diagnosis not present

## 2020-12-08 DIAGNOSIS — K254 Chronic or unspecified gastric ulcer with hemorrhage: Secondary | ICD-10-CM | POA: Diagnosis not present

## 2020-12-08 DIAGNOSIS — K269 Duodenal ulcer, unspecified as acute or chronic, without hemorrhage or perforation: Secondary | ICD-10-CM | POA: Diagnosis not present

## 2020-12-08 DIAGNOSIS — D62 Acute posthemorrhagic anemia: Secondary | ICD-10-CM | POA: Diagnosis not present

## 2020-12-08 DIAGNOSIS — K315 Obstruction of duodenum: Secondary | ICD-10-CM | POA: Diagnosis not present

## 2020-12-08 DIAGNOSIS — N179 Acute kidney failure, unspecified: Secondary | ICD-10-CM | POA: Diagnosis not present

## 2020-12-09 DIAGNOSIS — N179 Acute kidney failure, unspecified: Secondary | ICD-10-CM | POA: Diagnosis not present

## 2020-12-09 DIAGNOSIS — K219 Gastro-esophageal reflux disease without esophagitis: Secondary | ICD-10-CM | POA: Diagnosis not present

## 2020-12-09 DIAGNOSIS — E7800 Pure hypercholesterolemia, unspecified: Secondary | ICD-10-CM | POA: Diagnosis not present

## 2020-12-09 DIAGNOSIS — K315 Obstruction of duodenum: Secondary | ICD-10-CM | POA: Diagnosis not present

## 2020-12-09 DIAGNOSIS — F17200 Nicotine dependence, unspecified, uncomplicated: Secondary | ICD-10-CM | POA: Diagnosis not present

## 2020-12-09 DIAGNOSIS — D62 Acute posthemorrhagic anemia: Secondary | ICD-10-CM | POA: Diagnosis not present

## 2020-12-09 DIAGNOSIS — F4310 Post-traumatic stress disorder, unspecified: Secondary | ICD-10-CM | POA: Diagnosis not present

## 2020-12-09 DIAGNOSIS — K269 Duodenal ulcer, unspecified as acute or chronic, without hemorrhage or perforation: Secondary | ICD-10-CM | POA: Diagnosis not present

## 2020-12-09 DIAGNOSIS — K254 Chronic or unspecified gastric ulcer with hemorrhage: Secondary | ICD-10-CM | POA: Diagnosis not present

## 2020-12-10 DIAGNOSIS — K315 Obstruction of duodenum: Secondary | ICD-10-CM | POA: Diagnosis not present

## 2020-12-10 DIAGNOSIS — K254 Chronic or unspecified gastric ulcer with hemorrhage: Secondary | ICD-10-CM | POA: Diagnosis not present

## 2020-12-10 DIAGNOSIS — K269 Duodenal ulcer, unspecified as acute or chronic, without hemorrhage or perforation: Secondary | ICD-10-CM | POA: Diagnosis not present

## 2020-12-10 DIAGNOSIS — D62 Acute posthemorrhagic anemia: Secondary | ICD-10-CM | POA: Diagnosis not present

## 2020-12-10 DIAGNOSIS — N179 Acute kidney failure, unspecified: Secondary | ICD-10-CM | POA: Diagnosis not present

## 2020-12-11 DIAGNOSIS — D62 Acute posthemorrhagic anemia: Secondary | ICD-10-CM | POA: Diagnosis not present

## 2020-12-11 DIAGNOSIS — N179 Acute kidney failure, unspecified: Secondary | ICD-10-CM | POA: Diagnosis not present

## 2020-12-11 DIAGNOSIS — K254 Chronic or unspecified gastric ulcer with hemorrhage: Secondary | ICD-10-CM | POA: Diagnosis not present

## 2020-12-14 DIAGNOSIS — K269 Duodenal ulcer, unspecified as acute or chronic, without hemorrhage or perforation: Secondary | ICD-10-CM | POA: Diagnosis not present

## 2020-12-14 DIAGNOSIS — K264 Chronic or unspecified duodenal ulcer with hemorrhage: Secondary | ICD-10-CM | POA: Diagnosis not present

## 2020-12-23 DIAGNOSIS — F411 Generalized anxiety disorder: Secondary | ICD-10-CM | POA: Diagnosis not present

## 2020-12-23 DIAGNOSIS — K254 Chronic or unspecified gastric ulcer with hemorrhage: Secondary | ICD-10-CM | POA: Diagnosis not present

## 2020-12-24 ENCOUNTER — Ambulatory Visit: Payer: Medicare HMO

## 2021-03-01 DIAGNOSIS — H11431 Conjunctival hyperemia, right eye: Secondary | ICD-10-CM | POA: Diagnosis not present

## 2021-03-29 ENCOUNTER — Other Ambulatory Visit: Payer: Self-pay

## 2021-03-29 ENCOUNTER — Ambulatory Visit
Admission: RE | Admit: 2021-03-29 | Discharge: 2021-03-29 | Disposition: A | Discharge status: Home / Self Care | Payer: Medicare HMO | Source: Ambulatory Visit | Attending: Family Medicine | Admitting: Family Medicine

## 2021-03-29 ENCOUNTER — Other Ambulatory Visit: Payer: Self-pay | Admitting: Family Medicine

## 2021-03-29 DIAGNOSIS — R519 Headache, unspecified: Secondary | ICD-10-CM

## 2021-05-19 DIAGNOSIS — H52223 Regular astigmatism, bilateral: Secondary | ICD-10-CM | POA: Diagnosis not present

## 2021-05-19 DIAGNOSIS — H26493 Other secondary cataract, bilateral: Secondary | ICD-10-CM | POA: Diagnosis not present

## 2021-05-19 DIAGNOSIS — Z135 Encounter for screening for eye and ear disorders: Secondary | ICD-10-CM | POA: Diagnosis not present

## 2021-05-19 DIAGNOSIS — H02132 Senile ectropion of right lower eyelid: Secondary | ICD-10-CM | POA: Diagnosis not present

## 2021-05-19 DIAGNOSIS — Z961 Presence of intraocular lens: Secondary | ICD-10-CM | POA: Diagnosis not present

## 2021-06-07 DIAGNOSIS — H02102 Unspecified ectropion of right lower eyelid: Secondary | ICD-10-CM | POA: Diagnosis not present

## 2021-06-07 DIAGNOSIS — H26493 Other secondary cataract, bilateral: Secondary | ICD-10-CM | POA: Diagnosis not present

## 2021-06-07 DIAGNOSIS — H43813 Vitreous degeneration, bilateral: Secondary | ICD-10-CM | POA: Diagnosis not present

## 2021-06-07 DIAGNOSIS — Z961 Presence of intraocular lens: Secondary | ICD-10-CM | POA: Diagnosis not present

## 2021-06-07 DIAGNOSIS — H26491 Other secondary cataract, right eye: Secondary | ICD-10-CM | POA: Diagnosis not present

## 2021-07-06 DIAGNOSIS — H26491 Other secondary cataract, right eye: Secondary | ICD-10-CM | POA: Diagnosis not present

## 2021-09-26 ENCOUNTER — Encounter: Payer: Self-pay | Admitting: Gastroenterology

## 2021-11-14 DIAGNOSIS — L820 Inflamed seborrheic keratosis: Secondary | ICD-10-CM | POA: Diagnosis not present

## 2021-12-30 DIAGNOSIS — N1831 Chronic kidney disease, stage 3a: Secondary | ICD-10-CM | POA: Diagnosis not present

## 2021-12-30 DIAGNOSIS — M810 Age-related osteoporosis without current pathological fracture: Secondary | ICD-10-CM | POA: Diagnosis not present

## 2021-12-30 DIAGNOSIS — F411 Generalized anxiety disorder: Secondary | ICD-10-CM | POA: Diagnosis not present

## 2021-12-30 DIAGNOSIS — Z6825 Body mass index (BMI) 25.0-25.9, adult: Secondary | ICD-10-CM | POA: Diagnosis not present

## 2021-12-30 DIAGNOSIS — E785 Hyperlipidemia, unspecified: Secondary | ICD-10-CM | POA: Diagnosis not present

## 2021-12-30 DIAGNOSIS — Z8719 Personal history of other diseases of the digestive system: Secondary | ICD-10-CM | POA: Diagnosis not present

## 2021-12-30 DIAGNOSIS — I129 Hypertensive chronic kidney disease with stage 1 through stage 4 chronic kidney disease, or unspecified chronic kidney disease: Secondary | ICD-10-CM | POA: Diagnosis not present

## 2021-12-30 DIAGNOSIS — G2581 Restless legs syndrome: Secondary | ICD-10-CM | POA: Diagnosis not present

## 2021-12-30 DIAGNOSIS — Z23 Encounter for immunization: Secondary | ICD-10-CM | POA: Diagnosis not present

## 2022-09-13 DIAGNOSIS — N1831 Chronic kidney disease, stage 3a: Secondary | ICD-10-CM | POA: Diagnosis not present

## 2022-09-13 DIAGNOSIS — M810 Age-related osteoporosis without current pathological fracture: Secondary | ICD-10-CM | POA: Diagnosis not present

## 2022-09-13 DIAGNOSIS — Z23 Encounter for immunization: Secondary | ICD-10-CM | POA: Diagnosis not present

## 2022-09-13 DIAGNOSIS — I129 Hypertensive chronic kidney disease with stage 1 through stage 4 chronic kidney disease, or unspecified chronic kidney disease: Secondary | ICD-10-CM | POA: Diagnosis not present

## 2022-09-13 DIAGNOSIS — Z8719 Personal history of other diseases of the digestive system: Secondary | ICD-10-CM | POA: Diagnosis not present

## 2022-09-13 DIAGNOSIS — R5383 Other fatigue: Secondary | ICD-10-CM | POA: Diagnosis not present

## 2022-09-13 DIAGNOSIS — D649 Anemia, unspecified: Secondary | ICD-10-CM | POA: Diagnosis not present

## 2022-09-13 DIAGNOSIS — E785 Hyperlipidemia, unspecified: Secondary | ICD-10-CM | POA: Diagnosis not present

## 2022-09-13 DIAGNOSIS — Z86010 Personal history of colonic polyps: Secondary | ICD-10-CM | POA: Diagnosis not present

## 2022-09-13 DIAGNOSIS — G2581 Restless legs syndrome: Secondary | ICD-10-CM | POA: Diagnosis not present

## 2022-12-18 DIAGNOSIS — N1831 Chronic kidney disease, stage 3a: Secondary | ICD-10-CM | POA: Diagnosis not present

## 2022-12-18 DIAGNOSIS — I129 Hypertensive chronic kidney disease with stage 1 through stage 4 chronic kidney disease, or unspecified chronic kidney disease: Secondary | ICD-10-CM | POA: Diagnosis not present

## 2022-12-18 DIAGNOSIS — D649 Anemia, unspecified: Secondary | ICD-10-CM | POA: Diagnosis not present

## 2022-12-18 DIAGNOSIS — F5101 Primary insomnia: Secondary | ICD-10-CM | POA: Diagnosis not present

## 2022-12-18 DIAGNOSIS — Z0000 Encounter for general adult medical examination without abnormal findings: Secondary | ICD-10-CM | POA: Diagnosis not present

## 2022-12-18 DIAGNOSIS — Z8719 Personal history of other diseases of the digestive system: Secondary | ICD-10-CM | POA: Diagnosis not present

## 2022-12-18 DIAGNOSIS — E785 Hyperlipidemia, unspecified: Secondary | ICD-10-CM | POA: Diagnosis not present

## 2022-12-18 DIAGNOSIS — I10 Essential (primary) hypertension: Secondary | ICD-10-CM | POA: Diagnosis not present

## 2022-12-18 DIAGNOSIS — M810 Age-related osteoporosis without current pathological fracture: Secondary | ICD-10-CM | POA: Diagnosis not present

## 2022-12-29 ENCOUNTER — Other Ambulatory Visit: Payer: Self-pay | Admitting: Family Medicine

## 2022-12-29 DIAGNOSIS — N289 Disorder of kidney and ureter, unspecified: Secondary | ICD-10-CM

## 2023-01-15 ENCOUNTER — Other Ambulatory Visit: Payer: Medicare HMO

## 2023-03-12 ENCOUNTER — Inpatient Hospital Stay: Admission: RE | Admit: 2023-03-12 | Payer: Medicare HMO | Source: Ambulatory Visit

## 2023-04-21 IMAGING — CT CT HEAD W/O CM
1 series · 15 of 30 positions shown, 19 images · non-contrast
Comparison: No pertinent prior exams available for comparison.

CLINICAL DATA: Provided history: Acute intractable headache,
unspecified headache type. Additional history provided: Severe
headache, jaw pain, symptoms for 2 months.



[Series 2: head w/(date) · axial · 0.41mm/px · z∈[-518,-378]mm · 15 of 32 slices shown, 19 images]
[im 2/32  brain]
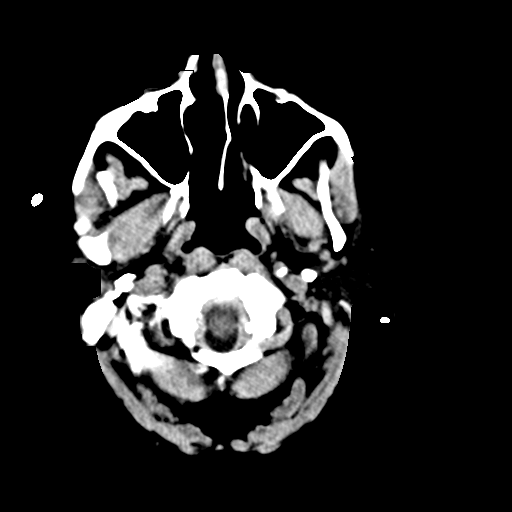
[im 2/32  bone]
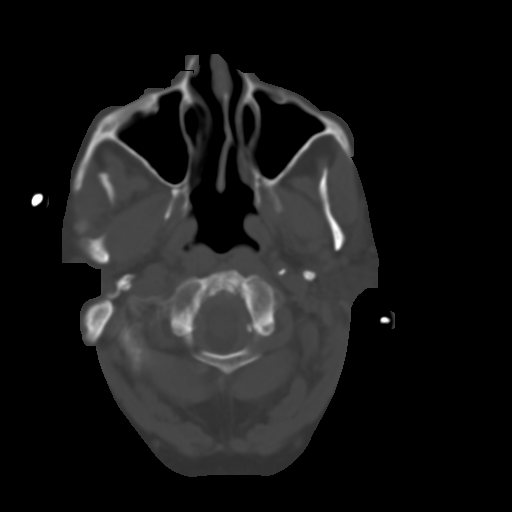
[im 4/32  brain]
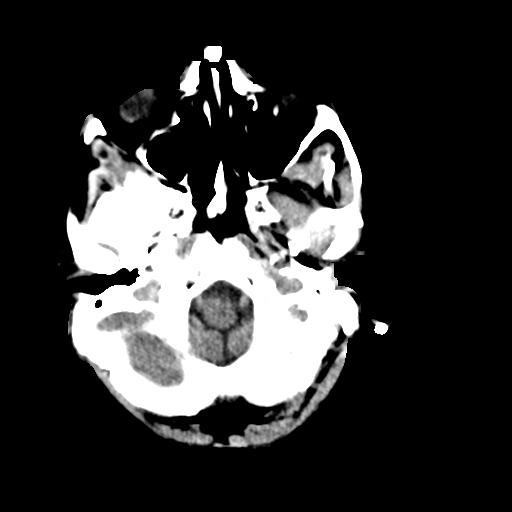
[im 6/32  brain]
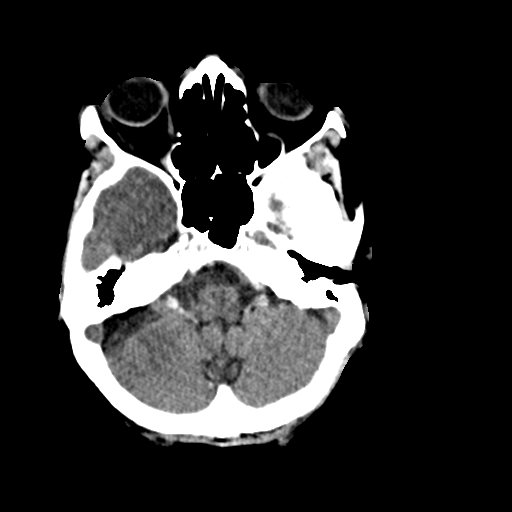
[im 8/32  brain]
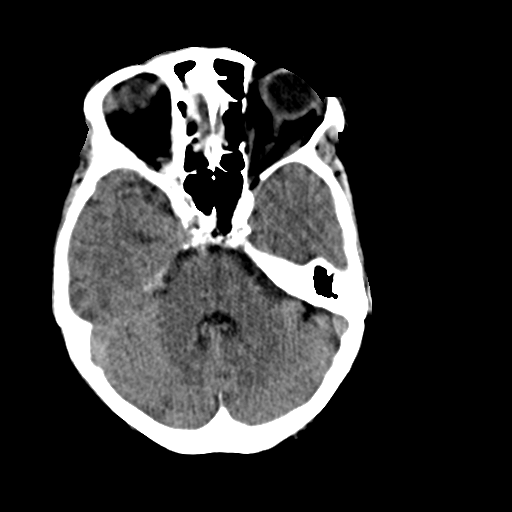
[im 10/32  brain]
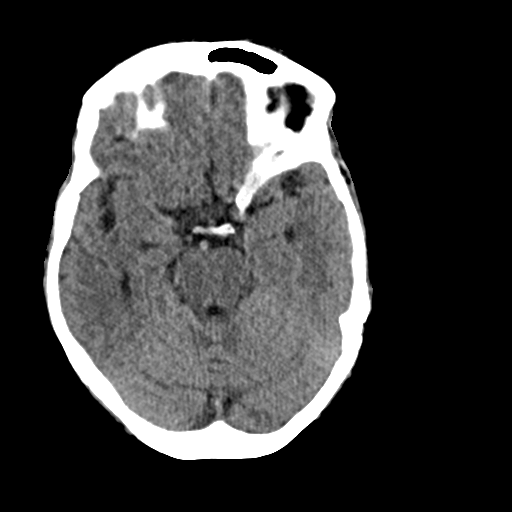
[im 10/32  bone]
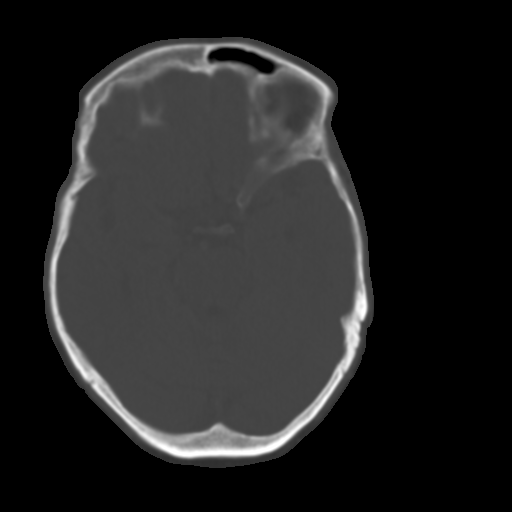
[im 12/32  brain]
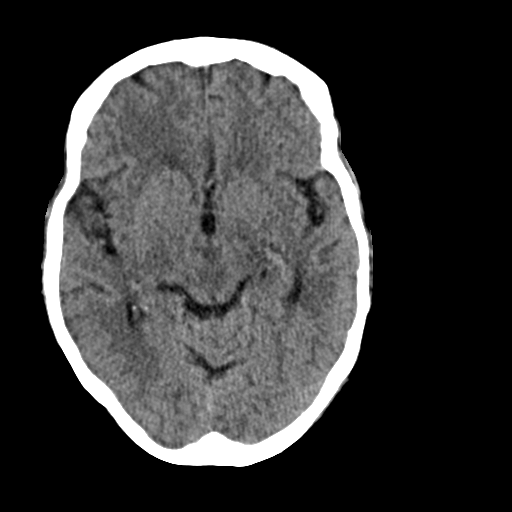
[im 14/32  brain]
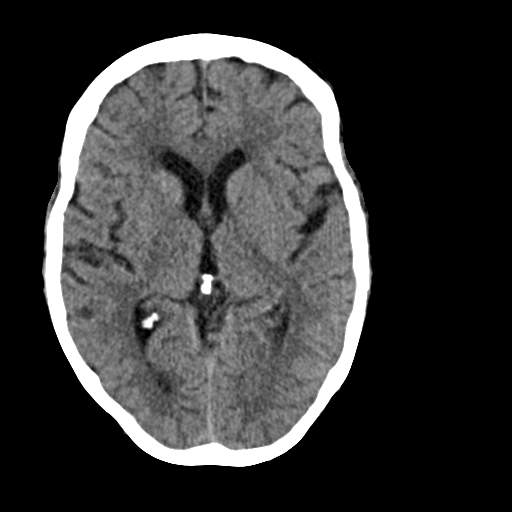
[im 17/32  brain]
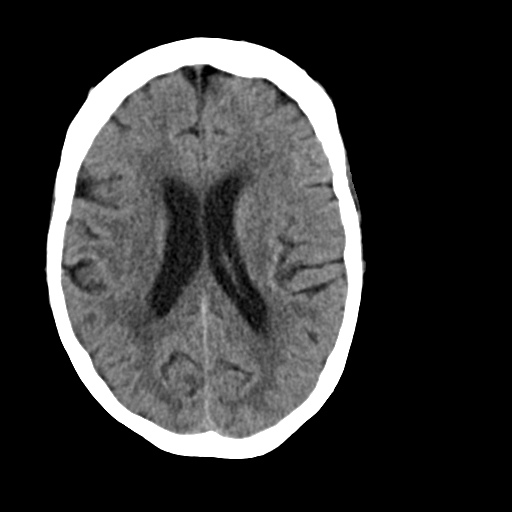
[im 18/32  brain]
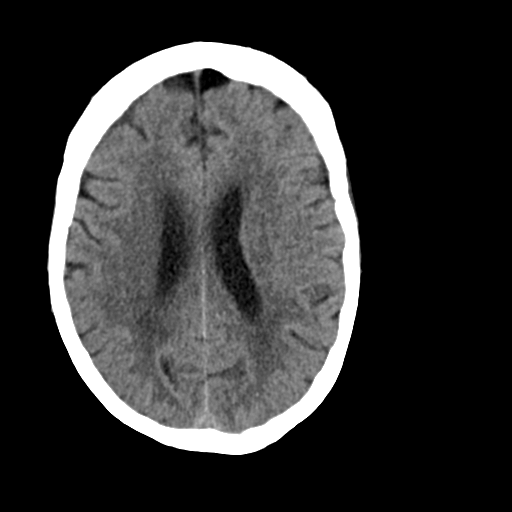
[im 18/32  bone]
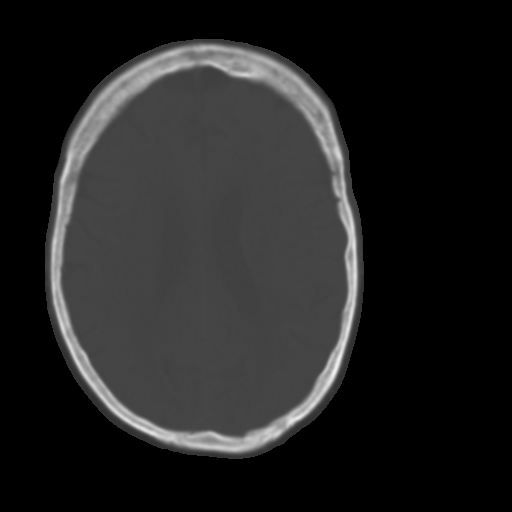
[im 20/32  brain]
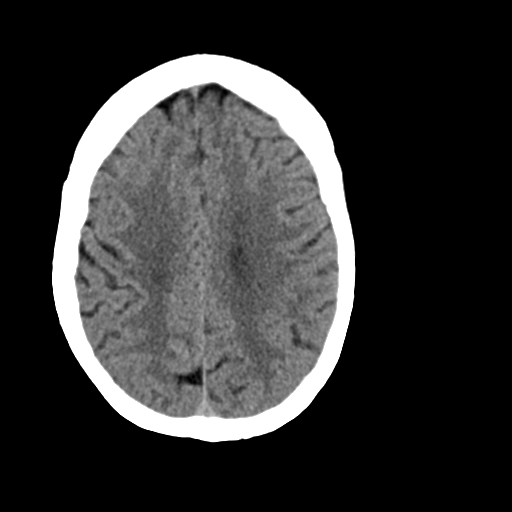
[im 22/32  brain]
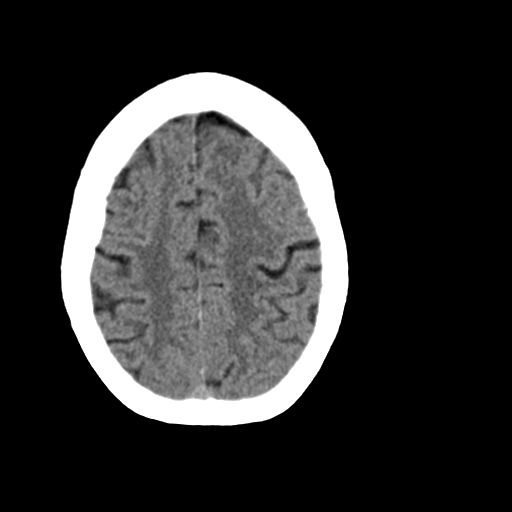
[im 24/32  brain]
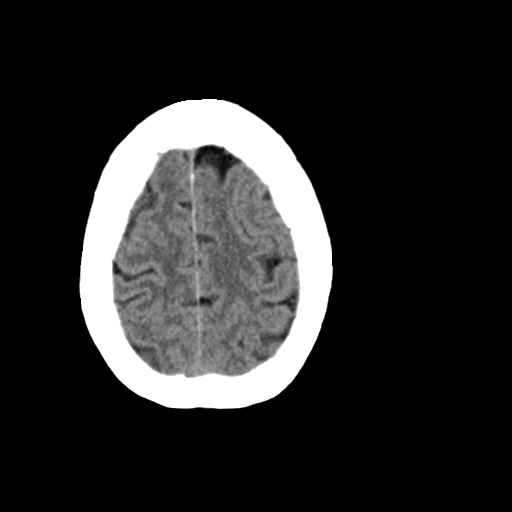
[im 26/32  brain]
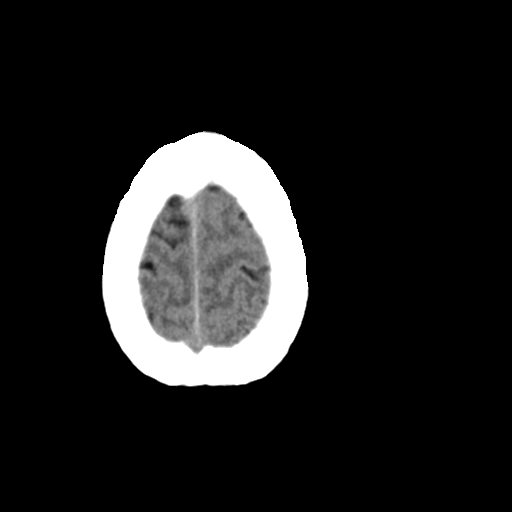
[im 26/32  bone]
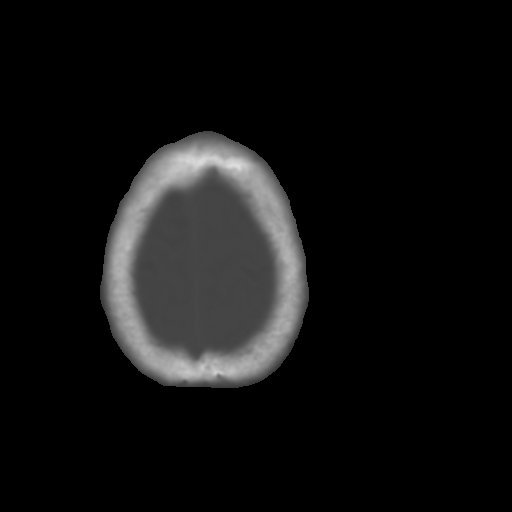
[im 28/32  brain]
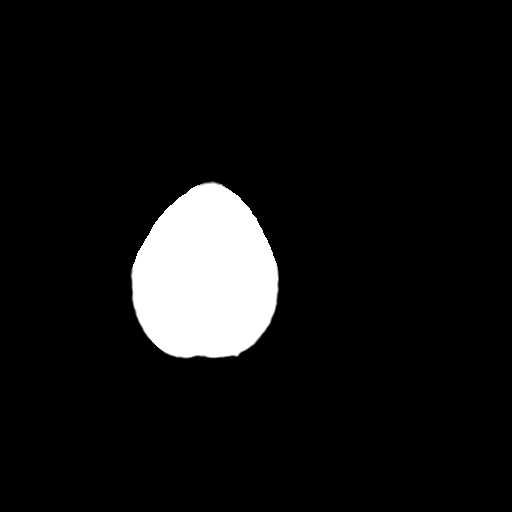
[im 30/32  brain]
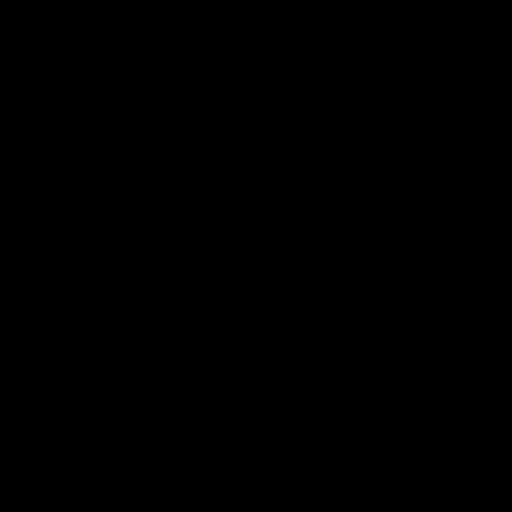

[15 of 30 positions shown; findings below may reference images not displayed]

FINDINGS: Brain:

Mild generalized cerebral and cerebellar atrophy.

Minimal patchy and ill-defined hypoattenuation within the cerebral
white matter, nonspecific but compatible with chronic small vessel
ischemic disease.

There is no acute intracranial hemorrhage.

No demarcated cortical infarct.

No extra-axial fluid collection.

No evidence of an intracranial mass.

No midline shift.

Vascular: No hyperdense vessel.  Atherosclerotic calcifications.

Skull: Normal. Negative for fracture or focal lesion.

Sinuses/Orbits: Visualized orbits show no acute finding. No
significant paranasal sinus disease at the imaged levels.

Other: Bilateral mastoid effusions. Debris within the left external
auditory canal. Somewhat prominent osteoarthritic changes of the
left temporomandibular joint.
IMPRESSION: No evidence of acute intracranial abnormality.

Minimal chronic small-vessel ischemic changes within the cerebral
white matter.

Mild generalized parenchymal atrophy.

Bilateral mastoid effusions.

Debris within the left external auditory canal.

Somewhat prominent osteoarthritic changes of the left
temporomandibular joint.

## 2023-07-14 DIAGNOSIS — E785 Hyperlipidemia, unspecified: Secondary | ICD-10-CM | POA: Diagnosis not present

## 2023-07-14 DIAGNOSIS — I129 Hypertensive chronic kidney disease with stage 1 through stage 4 chronic kidney disease, or unspecified chronic kidney disease: Secondary | ICD-10-CM | POA: Diagnosis not present

## 2023-07-14 DIAGNOSIS — M810 Age-related osteoporosis without current pathological fracture: Secondary | ICD-10-CM | POA: Diagnosis not present

## 2023-07-14 DIAGNOSIS — I10 Essential (primary) hypertension: Secondary | ICD-10-CM | POA: Diagnosis not present

## 2023-08-09 DIAGNOSIS — F3342 Major depressive disorder, recurrent, in full remission: Secondary | ICD-10-CM | POA: Diagnosis not present

## 2023-08-09 DIAGNOSIS — I129 Hypertensive chronic kidney disease with stage 1 through stage 4 chronic kidney disease, or unspecified chronic kidney disease: Secondary | ICD-10-CM | POA: Diagnosis not present

## 2023-08-09 DIAGNOSIS — Z79899 Other long term (current) drug therapy: Secondary | ICD-10-CM | POA: Diagnosis not present

## 2023-08-09 DIAGNOSIS — G2581 Restless legs syndrome: Secondary | ICD-10-CM | POA: Diagnosis not present

## 2023-08-09 DIAGNOSIS — D509 Iron deficiency anemia, unspecified: Secondary | ICD-10-CM | POA: Diagnosis not present

## 2023-08-09 DIAGNOSIS — Z8719 Personal history of other diseases of the digestive system: Secondary | ICD-10-CM | POA: Diagnosis not present

## 2023-08-09 DIAGNOSIS — N184 Chronic kidney disease, stage 4 (severe): Secondary | ICD-10-CM | POA: Diagnosis not present

## 2023-08-09 DIAGNOSIS — D649 Anemia, unspecified: Secondary | ICD-10-CM | POA: Diagnosis not present

## 2023-08-09 DIAGNOSIS — E785 Hyperlipidemia, unspecified: Secondary | ICD-10-CM | POA: Diagnosis not present

## 2023-08-09 DIAGNOSIS — M810 Age-related osteoporosis without current pathological fracture: Secondary | ICD-10-CM | POA: Diagnosis not present

## 2023-08-09 DIAGNOSIS — Z0000 Encounter for general adult medical examination without abnormal findings: Secondary | ICD-10-CM | POA: Diagnosis not present

## 2023-09-13 DIAGNOSIS — E785 Hyperlipidemia, unspecified: Secondary | ICD-10-CM | POA: Diagnosis not present

## 2023-09-13 DIAGNOSIS — I10 Essential (primary) hypertension: Secondary | ICD-10-CM | POA: Diagnosis not present

## 2023-09-13 DIAGNOSIS — I129 Hypertensive chronic kidney disease with stage 1 through stage 4 chronic kidney disease, or unspecified chronic kidney disease: Secondary | ICD-10-CM | POA: Diagnosis not present

## 2023-09-13 DIAGNOSIS — M810 Age-related osteoporosis without current pathological fracture: Secondary | ICD-10-CM | POA: Diagnosis not present

## 2023-10-14 DIAGNOSIS — E785 Hyperlipidemia, unspecified: Secondary | ICD-10-CM | POA: Diagnosis not present

## 2023-10-14 DIAGNOSIS — I10 Essential (primary) hypertension: Secondary | ICD-10-CM | POA: Diagnosis not present

## 2023-10-14 DIAGNOSIS — I129 Hypertensive chronic kidney disease with stage 1 through stage 4 chronic kidney disease, or unspecified chronic kidney disease: Secondary | ICD-10-CM | POA: Diagnosis not present

## 2023-10-14 DIAGNOSIS — M810 Age-related osteoporosis without current pathological fracture: Secondary | ICD-10-CM | POA: Diagnosis not present

## 2023-11-13 DIAGNOSIS — M810 Age-related osteoporosis without current pathological fracture: Secondary | ICD-10-CM | POA: Diagnosis not present

## 2023-11-13 DIAGNOSIS — I10 Essential (primary) hypertension: Secondary | ICD-10-CM | POA: Diagnosis not present

## 2023-11-13 DIAGNOSIS — I129 Hypertensive chronic kidney disease with stage 1 through stage 4 chronic kidney disease, or unspecified chronic kidney disease: Secondary | ICD-10-CM | POA: Diagnosis not present

## 2023-11-13 DIAGNOSIS — E785 Hyperlipidemia, unspecified: Secondary | ICD-10-CM | POA: Diagnosis not present

## 2023-12-14 DIAGNOSIS — E785 Hyperlipidemia, unspecified: Secondary | ICD-10-CM | POA: Diagnosis not present

## 2023-12-14 DIAGNOSIS — I10 Essential (primary) hypertension: Secondary | ICD-10-CM | POA: Diagnosis not present

## 2023-12-14 DIAGNOSIS — I129 Hypertensive chronic kidney disease with stage 1 through stage 4 chronic kidney disease, or unspecified chronic kidney disease: Secondary | ICD-10-CM | POA: Diagnosis not present

## 2023-12-14 DIAGNOSIS — M810 Age-related osteoporosis without current pathological fracture: Secondary | ICD-10-CM | POA: Diagnosis not present

## 2024-01-13 DIAGNOSIS — I129 Hypertensive chronic kidney disease with stage 1 through stage 4 chronic kidney disease, or unspecified chronic kidney disease: Secondary | ICD-10-CM | POA: Diagnosis not present

## 2024-01-13 DIAGNOSIS — M810 Age-related osteoporosis without current pathological fracture: Secondary | ICD-10-CM | POA: Diagnosis not present

## 2024-01-13 DIAGNOSIS — E785 Hyperlipidemia, unspecified: Secondary | ICD-10-CM | POA: Diagnosis not present

## 2024-01-13 DIAGNOSIS — I10 Essential (primary) hypertension: Secondary | ICD-10-CM | POA: Diagnosis not present
